# Patient Record
Sex: Female | Born: 1990 | Race: White | Hispanic: No | Marital: Single
Health system: Southern US, Community
[De-identification: ages and names within clinical notes are randomized; demographics above are authoritative.]

---

## 2014-02-04 ENCOUNTER — Emergency Department (HOSPITAL_COMMUNITY)
Admission: EM | Admit: 2014-02-04 | Discharge: 2014-02-04 | Disposition: A | Discharge status: Home / Self Care | Payer: Medicaid Other | Attending: Emergency Medicine | Admitting: Emergency Medicine

## 2014-02-04 ENCOUNTER — Emergency Department (HOSPITAL_COMMUNITY): Payer: Medicaid Other

## 2014-02-04 ENCOUNTER — Encounter (HOSPITAL_COMMUNITY): Payer: Self-pay | Admitting: Emergency Medicine

## 2014-02-04 DIAGNOSIS — Z3202 Encounter for pregnancy test, result negative: Secondary | ICD-10-CM | POA: Insufficient documentation

## 2014-02-04 DIAGNOSIS — M79609 Pain in unspecified limb: Secondary | ICD-10-CM

## 2014-02-04 DIAGNOSIS — M79604 Pain in right leg: Secondary | ICD-10-CM | POA: Insufficient documentation

## 2014-02-04 DIAGNOSIS — G8911 Acute pain due to trauma: Secondary | ICD-10-CM | POA: Diagnosis not present

## 2014-02-04 LAB — POC URINE PREG, ED: Preg Test, Ur: NEGATIVE

## 2014-02-04 MED ORDER — METHOCARBAMOL 750 MG PO TABS: 750.0000 mg | ORAL_TABLET | Freq: Four times a day (QID) | ORAL | Status: AC | PRN

## 2014-02-04 NOTE — Progress Notes (Signed)
VASCULAR LAB PRELIMINARY  PRELIMINARY  PRELIMINARY  PRELIMINARY  Right lower extremity venous Doppler completed.    Preliminary report:  There is no DVT or SVT noted in the right lower extremity.   Reilley Latorre, RVT 02/04/2014, 10:17 AM

## 2014-02-04 NOTE — ED Notes (Signed)
Declined W/C at D/C and was escorted to lobby by RN. 

## 2014-02-04 NOTE — Discharge Instructions (Signed)
Read the information below.  Use the prescribed medication as directed.  Please discuss all new medications with your pharmacist.  You may return to the Emergency Department at any time for worsening condition or any new symptoms that concern you.   If you develop uncontrolled pain, weakness or numbness of the extremity, severe discoloration of the skin, or you are unable to walk, return to the ER for a recheck.    ° ° °Musculoskeletal Pain °Musculoskeletal pain is muscle and boney aches and pains. These pains can occur in any part of the body. Your caregiver may treat you without knowing the cause of the pain. They may treat you if blood or urine tests, X-rays, and other tests were normal.  °CAUSES °There is often not a definite cause or reason for these pains. These pains may be caused by a type of germ (virus). The discomfort may also come from overuse. Overuse includes working out too hard when your body is not fit. Boney aches also come from weather changes. Bone is sensitive to atmospheric pressure changes. °HOME CARE INSTRUCTIONS  °· Ask when your test results will be ready. Make sure you get your test results. °· Only take over-the-counter or prescription medicines for pain, discomfort, or fever as directed by your caregiver. If you were given medications for your condition, do not drive, operate machinery or power tools, or sign legal documents for 24 hours. Do not drink alcohol. Do not take sleeping pills or other medications that may interfere with treatment. °· Continue all activities unless the activities cause more pain. When the pain lessens, slowly resume normal activities. Gradually increase the intensity and duration of the activities or exercise. °· During periods of severe pain, bed rest may be helpful. Lay or sit in any position that is comfortable. °· Putting ice on the injured area. °¨ Put ice in a bag. °¨ Place a towel between your skin and the bag. °¨ Leave the ice on for 15 to 20 minutes, 3  to 4 times a day. °· Follow up with your caregiver for continued problems and no reason can be found for the pain. If the pain becomes worse or does not go away, it may be necessary to repeat tests or do additional testing. Your caregiver may need to look further for a possible cause. °SEEK IMMEDIATE MEDICAL CARE IF: °· You have pain that is getting worse and is not relieved by medications. °· You develop chest pain that is associated with shortness or breath, sweating, feeling sick to your stomach (nauseous), or throw up (vomit). °· Your pain becomes localized to the abdomen. °· You develop any new symptoms that seem different or that concern you. °MAKE SURE YOU:  °· Understand these instructions. °· Will watch your condition. °· Will get help right away if you are not doing well or get worse. °Document Released: 04/07/2005 Document Revised: 06/30/2011 Document Reviewed: 12/10/2012 °[employer]® Patient Information ©2015 [employer]. This information is not intended to replace advice given to you by your health care provider. Make sure you discuss any questions you have with your health care provider. ° °
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
## 2014-02-05 NOTE — ED Provider Notes (Signed)
Medical screening examination/treatment/procedure(s) were performed by non-physician practitioner and as supervising physician I was immediately available for consultation/collaboration.   Trey Illyana Schorsch, MD 02/05/14 0752 
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
